# Patient Record
Sex: Female | Born: 2000
Health system: Southern US, Community
[De-identification: ages and names within clinical notes are randomized; demographics above are authoritative.]

## PROBLEM LIST (undated history)

## (undated) DIAGNOSIS — T384X5A Adverse effect of oral contraceptives, initial encounter: Secondary | ICD-10-CM

## (undated) DIAGNOSIS — E669 Obesity, unspecified: Secondary | ICD-10-CM

## (undated) DIAGNOSIS — Z7289 Other problems related to lifestyle: Secondary | ICD-10-CM

## (undated) DIAGNOSIS — F419 Anxiety disorder, unspecified: Secondary | ICD-10-CM

## (undated) HISTORY — DX: Adverse effect of oral contraceptives, initial encounter: T38.4X5A

## (undated) HISTORY — DX: Obesity, unspecified: E66.9

## (undated) HISTORY — DX: Other problems related to lifestyle: Z72.89

## (undated) HISTORY — DX: Anxiety disorder, unspecified: F41.9

## (undated) HISTORY — PX: APPENDECTOMY: SHX54

---
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
## 2022-08-13 ENCOUNTER — Ambulatory Visit (INDEPENDENT_AMBULATORY_CARE_PROVIDER_SITE_OTHER): Payer: 59 | Admitting: Nurse Practitioner

## 2022-08-13 ENCOUNTER — Encounter: Payer: Self-pay | Admitting: Nurse Practitioner

## 2022-08-13 VITALS — BP 118/68 | HR 65 | Ht 67.75 in | Wt 293.2 lb

## 2022-08-13 DIAGNOSIS — R599 Enlarged lymph nodes, unspecified: Secondary | ICD-10-CM | POA: Diagnosis not present

## 2022-08-13 DIAGNOSIS — Z789 Other specified health status: Secondary | ICD-10-CM | POA: Diagnosis not present

## 2022-08-13 DIAGNOSIS — Z6841 Body Mass Index (BMI) 40.0 and over, adult: Secondary | ICD-10-CM | POA: Diagnosis not present

## 2022-08-13 DIAGNOSIS — Z0000 Encounter for general adult medical examination without abnormal findings: Secondary | ICD-10-CM | POA: Insufficient documentation

## 2022-08-13 DIAGNOSIS — Z833 Family history of diabetes mellitus: Secondary | ICD-10-CM | POA: Insufficient documentation

## 2022-08-13 DIAGNOSIS — N341 Nonspecific urethritis: Secondary | ICD-10-CM

## 2022-08-13 DIAGNOSIS — A493 Mycoplasma infection, unspecified site: Secondary | ICD-10-CM

## 2022-08-13 NOTE — Assessment & Plan Note (Signed)
Will monitor labs today.

## 2022-08-13 NOTE — Assessment & Plan Note (Signed)
Patient reports a tender lump at the bottom of the right ear. Examination revealed a swollen lymph node on the right side at post auricular chain, less than 1cm. No other symptoms.  Plan: Patient to monitor the lump for changes in size, tenderness, or other symptoms. If the lump worsens or does not improve, consider an ultrasound for further evaluation. Suspect this is a simple immune response

## 2022-08-13 NOTE — Assessment & Plan Note (Signed)
Patient expresses concerns regarding weight and potential future health risks. She is working on diet and exercise, but this is not making a big difference in her size, which is her concern.  Plan: Encourage continuation of a healthy lifestyle with a balanced diet and regular exercise. Monitor blood work to assess for underlying metabolic issues. Discuss potential medication options for weight loss if indicated after lab results. Detailed dietary information provided on AVS

## 2022-08-13 NOTE — Assessment & Plan Note (Signed)

## 2022-08-13 NOTE — Progress Notes (Signed)
Worthy Keeler, DNP, AGNP-c [place], [place] [address] Main Office [phone]  BP 118/68   Pulse 65   Ht 5' 7.75" (1.721 m)   Wt 293 lb 3.2 oz (133 kg)   SpO2 98%   BMI 44.91 kg/m    Subjective:    Patient ID: Sharon Burns, female    DOB: May 16, 2001, 22 y.o.   MRN: [id number]  HPI: Sharon Burns is a 22 y.o. female presenting on 08/13/2022 for comprehensive medical examination.   The patient, Sharon Burns, presents today for an established care visit and physical. She reports a tender lump on the bottom of her right ear, approximately the size of a quarter. Sharon Burns is unsure if she received the HPV vaccines as a teenager and has not been to a doctor in a long time. She had a flu shot in October and currently has an IUD in place. Her medical history includes anxiety, for which she no longer takes medication. She has a history of vaping for one year but has been clean for 10 months.  Sharon Burns experienced a recent health scare after rock climbing and consuming greasy pizza, which resulted in shaking and feeling unwell, but she recovered the next day. She is in a monogamous relationship and agrees to STD testing. She reports no concerns with hearing, only the lump near her ear. She describes a sensation of phlegm in her throat that won't go down.  The patient is a vegetarian and focuses on nutritional value, fiber, and balanced macronutrients in her diet. She exercises through work, walking at [facility], and rock climbing two to three times a week. Sharon Burns reports occasional constipation, which she attributes to stress from work. She has not had a period in two months due to her IUD. She denies any swelling in her feet or ankles.  Sharon Burns expresses concern about her weight and family history of obesity, type 2 diabetes, and breast cancer. She has made lifestyle changes, including adopting a healthy diet and engaging in regular exercise, but is  worried about potential future health risks related to her weight. She is interested in exploring options for weight loss, including medication, and seeks advice on maintaining a healthy immune system.  IMMUNIZATIONS:   Flu vaccine: Completed in October Prevnar 13: Prevnar 13 N/A for this patient Prevnar 20: Prevnar 64 N/A for this patient Pneumovax 23: Pneumovax 23 N/A for this patient Vac Shingrix: Shingrix N/A for this patient HPV: Unsure Tetanus: Tetanus completed in the last 10 years  HEALTH MAINTENANCE: Pap Smear HM Status: is not applicable for this patient Mammogram HM Status: is not applicable for this patient Colon Cancer Screening HM Status: is not applicable for this patient Bone Density HM Status: is not applicable for this patient STI Testing HM Status: was completed today  She reports regular vision exams q1-5y: No  She reports regular dental exams q 69m  No  She follows a vegetarian diet and ensures that she gets proteins. She avoids pre-packed meals.She cooks most of her own meals. She focuses on nutritional value of foods.  She endorses exercise and/or activity of:  2-3 times a week vigorous rock climbing.  She currently: Marital Status: co-habitating Living situation: with family Sexual: monogamous Occupation: pCharity fundraiserat [employer]  Pertinent items are noted in HPI.  Most Recent Depression Screen:     08/13/2022   10:43 AM 03/19/2021    1:16 PM 02/19/2021    9:43 AM 04/13/2020  4:03 PM 01/16/2015   11:16 AM  Depression screen PHQ 2/9  Decreased Interest 0   0 1  Down, Depressed, Hopeless 0   0 2  PHQ - 2 Score 0   0 3  Altered sleeping 1    0  Tired, decreased energy 2    0  Change in appetite 2    0  Feeling bad or failure about yourself  0    0  Trouble concentrating 0    0  Moving slowly or fidgety/restless 0    0  Suicidal thoughts 0    0  PHQ-9 Score 5    3  Difficult doing work/chores Not difficult at all         Information  is confidential and restricted. Go to Review Flowsheets to unlock data.   Most Recent Anxiety Screen:     03/19/2021    1:18 PM 04/13/2020    4:03 PM  GAD 7 : Generalized Anxiety Score  Nervous, Anxious, on Edge  0  Control/stop worrying  0  Worry too much - different things  0  Trouble relaxing  0  Restless  0  Easily annoyed or irritable  0  Afraid - awful might happen  0  Total GAD 7 Score  0  Anxiety Difficulty  Not difficult at all     Information is confidential and restricted. Go to Review Flowsheets to unlock data.   Most Recent Fall Screen:    08/13/2022   10:43 AM 02/26/2021    3:30 PM 01/16/2015   11:16 AM  Fall Risk   Falls in the past year? 0 0 Yes  Number falls in past yr: 0 0 1  Injury with Fall? 0 0 No  Risk for fall due to : No Fall Risks No Fall Risks   Follow up Falls evaluation completed Falls evaluation completed     Past medical history, surgical history, medications, allergies, family history and social history reviewed with patient today and changes made to appropriate areas of the chart.  Past Medical History:  Past Medical History:  Diagnosis Date   Anxiety    Obesity (BMI 35.0-39.9 without comorbidity)    Oral contraceptive causing adverse effect in therapeutic use    Self-mutilation    Medications:  Current Outpatient Medications on File Prior to Visit  Medication Sig   levonorgestrel (MIRENA) 20 MCG/DAY IUD 1 each by Intrauterine route once.   No current facility-administered medications on file prior to visit.   Surgical History:  Past Surgical History:  Procedure Laterality Date   APPENDECTOMY     LAPAROSCOPIC APPENDECTOMY N/A 09/10/2016   Procedure: APPENDECTOMY LAPAROSCOPIC;  Surgeon: Gerald Stabs, MD;  Location: [facility] OR;  Service: General;  Laterality: N/A;   Allergies:  No Known Allergies Family History:  Family History  Problem Relation Age of Onset   Diabetes Mother    Hypertension Mother    COPD Maternal Aunt         Breast   Cancer Maternal Aunt    Hypertension Maternal Aunt    Alcohol abuse Maternal Grandfather    Hypertension Paternal Grandfather    Diabetes Paternal Grandfather    Bipolar disorder Sister    Anxiety disorder Sister    ADD / ADHD Sister    Alcohol abuse Other    Drug abuse Other        Objective:    BP 118/68   Pulse 65   Ht 5' 7.75" (1.721 m)  Wt 293 lb 3.2 oz (133 kg)   SpO2 98%   BMI 44.91 kg/m   Wt Readings from Last 3 Encounters:  08/13/22 293 lb 3.2 oz (133 kg) (>99 %, Z= 2.80)*  10/04/21 257 lb (116.6 kg) (>99 %, Z= 2.52)*  07/13/21 272 lb (123.4 kg) (>99 %, Z= 2.60)*   * Growth percentiles are based on CDC (Girls, 2-20 Years) data.    Physical Exam Vitals and nursing note reviewed.  Constitutional:      General: She is not in acute distress.    Appearance: Normal appearance.  HENT:     Head: Normocephalic and atraumatic.     Right Ear: Hearing, tympanic membrane, ear canal and external ear normal.     Left Ear: Hearing, tympanic membrane, ear canal and external ear normal.     Nose: Nose normal.     Right Sinus: No maxillary sinus tenderness or frontal sinus tenderness.     Left Sinus: No maxillary sinus tenderness or frontal sinus tenderness.     Mouth/Throat:     Lips: Pink.     Mouth: Mucous membranes are moist.     Pharynx: Oropharynx is clear.  Eyes:     General: Lids are normal. Vision grossly intact.     Extraocular Movements: Extraocular movements intact.     Conjunctiva/sclera: Conjunctivae normal.     Pupils: Pupils are equal, round, and reactive to light.     Funduscopic exam:    Right eye: Red reflex present.        Left eye: Red reflex present.    Visual Fields: Right eye visual fields normal and left eye visual fields normal.  Neck:     Thyroid: No thyromegaly.     Vascular: No carotid bruit.  Cardiovascular:     Rate and Rhythm: Normal rate and regular rhythm.     Chest Wall: PMI is not displaced.     Pulses: Normal pulses.           Dorsalis pedis pulses are 2+ on the right side and 2+ on the left side.       Posterior tibial pulses are 2+ on the right side and 2+ on the left side.     Heart sounds: Normal heart sounds. No murmur heard. Pulmonary:     Effort: Pulmonary effort is normal. No respiratory distress.     Breath sounds: Normal breath sounds.  Abdominal:     General: Abdomen is flat. Bowel sounds are normal. There is no distension.     Palpations: Abdomen is soft. There is no hepatomegaly, splenomegaly or mass.     Tenderness: There is no abdominal tenderness. There is no right CVA tenderness, left CVA tenderness, guarding or rebound.  Musculoskeletal:        General: Normal range of motion.     Cervical back: Full passive range of motion without pain, normal range of motion and neck supple. No tenderness.     Right lower leg: No edema.     Left lower leg: No edema.  Feet:     Left foot:     Toenail Condition: Left toenails are normal.  Lymphadenopathy:     Head:     Right side of head: Posterior auricular adenopathy present.     Left side of head: No posterior auricular adenopathy.     Cervical: No cervical adenopathy.     Upper Body:     Right upper body: No supraclavicular adenopathy.  Left upper body: No supraclavicular adenopathy.     Comments: Appx 1cm firm, round, smooth lymph node palpated on right post auricular chain. No alarm symptoms  Skin:    General: Skin is warm and dry.     Capillary Refill: Capillary refill takes less than 2 seconds.     Nails: There is no clubbing.  Neurological:     General: No focal deficit present.     Mental Status: She is alert and oriented to person, place, and time.     GCS: GCS eye subscore is 4. GCS verbal subscore is 5. GCS motor subscore is 6.     Sensory: Sensation is intact.     Motor: Motor function is intact.     Coordination: Coordination is intact.     Gait: Gait is intact.     Deep Tendon Reflexes: Reflexes are normal and symmetric.   Psychiatric:        Attention and Perception: Attention normal.        Mood and Affect: Mood normal.        Speech: Speech normal.        Behavior: Behavior normal. Behavior is cooperative.        Thought Content: Thought content normal.        Cognition and Memory: Cognition and memory normal.        Judgment: Judgment normal.     Results for orders placed or performed during the hospital encounter of 10/04/21  Novel Coronavirus, NAA ([facility])   Specimen: Nasopharyngeal Swab; Nasopharyngeal(NP) swabs in vial transport medium   Nasopharynge  Release  Result Value Ref Range   SARS-CoV-2, NAA Not Detected Not Detected  Culture, group A strep   Specimen: Throat  Result Value Ref Range   Specimen Description THROAT    Special Requests NONE    Culture      NO GROUP A STREP (S.PYOGENES) ISOLATED Performed at [facility] [address], [place], [place] [address]    Report Status 10/07/2021 FINAL   POCT rapid strep A  Result Value Ref Range   Rapid Strep A Screen Negative Negative         Assessment & Plan:   Problem List Items Addressed This Visit     Annual physical exam - Primary    CPE today with no abnormalities noted on exam.  Labs pending. Will make changes as necessary based on results.  Review of HM activities and recommendations discussed and provided on AVS Anticipatory guidance, diet, and exercise recommendations provided.  Medications, allergies, and hx reviewed and updated as necessary.  Plan to f/u with CPE in 1 year or sooner for acute/chronic health needs as directed.        Relevant Orders   Visual acuity screening   CBC with Differential/Platelet   Comprehensive metabolic panel   123456 and Folate Panel   Iron, TIBC and Ferritin Panel   Hemoglobin A1c   VITAMIN D 25 Hydroxy (Vit-D Deficiency, Fractures)   TSH   Ct, Ng, Mycoplasmas NAA, Urine   HSV(herpes simplex vrs) 1+2 ab-IgG   RPR   HIV Antibody (routine testing w rflx)   Hepatitis C  antibody   Vegetarian diet    Will monitor labs today.       Relevant Orders   CBC with Differential/Platelet   Comprehensive metabolic panel   123456 and Folate Panel   Iron, TIBC and Ferritin Panel   Hemoglobin A1c   VITAMIN D 25 Hydroxy (Vit-D  Deficiency, Fractures)   TSH   Family history of diabetes mellitus (DM)    Will monitor labs today.       Relevant Orders   CBC with Differential/Platelet   Comprehensive metabolic panel   123456 and Folate Panel   Iron, TIBC and Ferritin Panel   Hemoglobin A1c   VITAMIN D 25 Hydroxy (Vit-D Deficiency, Fractures)   TSH   Swollen lymph nodes    Patient reports a tender lump at the bottom of the right ear. Examination revealed a swollen lymph node on the right side at post auricular chain, less than 1cm. No other symptoms.  Plan: Patient to monitor the lump for changes in size, tenderness, or other symptoms. If the lump worsens or does not improve, consider an ultrasound for further evaluation. Suspect this is a simple immune response       BMI 40.0-44.9, adult [facility])    Patient expresses concerns regarding weight and potential future health risks. She is working on diet and exercise, but this is not making a big difference in her size, which is her concern.  Plan: Encourage continuation of a healthy lifestyle with a balanced diet and regular exercise. Monitor blood work to assess for underlying metabolic issues. Discuss potential medication options for weight loss if indicated after lab results. Detailed dietary information provided on AVS      Other Visit Diagnoses     Health care maintenance       Relevant Orders   CBC with Differential/Platelet   Comprehensive metabolic panel   123456 and Folate Panel   Iron, TIBC and Ferritin Panel   Hemoglobin A1c   VITAMIN D 25 Hydroxy (Vit-D Deficiency, Fractures)   TSH   Ct, Ng, Mycoplasmas NAA, Urine   HSV(herpes simplex vrs) 1+2 ab-IgG   RPR   HIV Antibody (routine testing w rflx)    Hepatitis C antibody          Follow up plan: Return in about 1 year (around 08/14/2023) for CPE.  NEXT PREVENTATIVE PHYSICAL DUE IN 1 YEAR.  PATIENT COUNSELING PROVIDED FOR ALL ADULT PATIENTS:  Consume a well balanced diet low in saturated fats, cholesterol, and moderation in carbohydrates.   This can be as simple as monitoring portion sizes and cutting back on sugary beverages such as soda and juice to start with.    Daily water consumption of at least 64 ounces.  Physical activity at least 180 minutes per week, if just starting out.   This can be as simple as taking the stairs instead of the elevator and walking 2-3 laps around the office  purposefully every day.   STD protection, partner selection, and regular testing if high risk.  Limited consumption of alcoholic beverages if alcohol is consumed.  For women, I recommend no more than 7 alcoholic beverages per week, spread out throughout the week.  Avoid "binge" drinking or consuming large quantities of alcohol in one setting.   Please let me know if you feel you may need help with reduction or quitting alcohol consumption.   Avoidance of nicotine, if used.  Please let me know if you feel you may need help with reduction or quitting nicotine use.   Daily mental health attention.  This can be in the form of 5 minute daily meditation, prayer, journaling, yoga, reflection, etc.   Purposeful attention to your emotions and mental state can significantly improve your overall wellbeing and Health.  Please know that I am here to help you with all  of your health care goals and am happy to work with you to find a solution that works best for you.  The greatest advice I have received with any changes in life are to take it one step at a time, that even means if all you can focus on is the next 60 seconds, then do that and celebrate your victories.  With any changes in life, you will have set backs, and that is OK. The important thing to  remember is, if you have a set back, it is not a failure, it is an opportunity to try again!  Health Maintenance Recommendations Screening Testing Mammogram Every 1 -2 years based on history and risk factors Starting at age 28 Pap Smear Ages 21-39 every 3 years Ages 52-65 every 5 years with HPV testing More frequent testing may be required based on results and history Colon Cancer Screening Every 1-10 years based on test performed, risk factors, and history Starting at age 65 Bone Density Screening Every 2-10 years based on history Starting at age 47 for women Recommendations for men differ based on medication usage, history, and risk factors AAA Screening One time ultrasound Men 1-63 years old who have every smoked Lung Cancer Screening Low Dose Lung CT every 12 months Age 60-80 years with a 30 pack-year smoking history who still smoke or who have quit within the last 15 years  Screening Labs Routine  Labs: Complete Blood Count (CBC), Complete Metabolic Panel (CMP), Cholesterol (Lipid Panel) Every 6-12 months based on history and medications May be recommended more frequently based on current conditions or previous results Hemoglobin A1c Lab Every 3-12 months based on history and previous results Starting at age 1 or earlier with diagnosis of diabetes, high cholesterol, BMI >26, and/or risk factors Frequent monitoring for patients with diabetes to ensure blood sugar control Thyroid Panel (TSH w/ T3 & T4) Every 6 months based on history, symptoms, and risk factors May be repeated more often if on medication HIV One time testing for all patients 59 and older May be repeated more frequently for patients with increased risk factors or exposure Hepatitis C One time testing for all patients 3 and older May be repeated more frequently for patients with increased risk factors or exposure Gonorrhea, Chlamydia Every 12 months for all sexually active persons 13-24  years Additional monitoring may be recommended for those who are considered high risk or who have symptoms PSA Men 50-64 years old with risk factors Additional screening may be recommended from age 33-69 based on risk factors, symptoms, and history  Vaccine Recommendations Tetanus Booster All adults every 10 years Flu Vaccine All patients 6 months and older every year COVID Vaccine All patients 12 years and older Initial dosing with booster May recommend additional booster based on age and health history HPV Vaccine 2 doses all patients age 28-26 Dosing may be considered for patients over 26 Shingles Vaccine (Shingrix) 2 doses all adults 27 years and older Pneumonia (Pneumovax 23) All adults 78 years and older May recommend earlier dosing based on health history Pneumonia (Prevnar 53) All adults 45 years and older Dosed 1 year after Pneumovax 23  Additional Screening, Testing, and Vaccinations may be recommended on an individualized basis based on family history, health history, risk factors, and/or exposure.

## 2022-08-13 NOTE — Patient Instructions (Addendum)
Dear Sharon Burns,  Thank you for visiting our clinic on Tue Aug 13, 2022. Your proactive approach to your health is commendable, and it was a pleasure to discuss your health concerns and lifestyle choices. Below, please find a summary of the key instructions and recommendations from your visit:  1. Monitor the lump on the bottom of your right ear for any changes in size or tenderness, and report any increases in size or changes.  2. Maintain your healthy vegetarian diet, ensuring it is rich in nutritional value, fiber, and a balanced intake of carbohydrates and fats.  3. Continue with your exercise routine, which includes rock climbing and walking at [facility]  4. For constipation, consider using a stool softener such as docusate sodium (Colace) or Miralax, starting with a single dose once daily.  5. Keep up with regular eye and dental exams to maintain overall health.  6. Supplement your diet with vitamin C, vitamin D, and possibly zinc and B12, given your vegetarian diet. We will check your vitamin D levels today.  7. If you are planning for pregnancy in the future, start a folic acid supplement or prenatal vitamin in advance.  8. Include antioxidants in your diet by consuming fruits like berries for their health benefits.  9. Choose between gummy and pill multivitamins based on your preference, ensuring gummies are chewed thoroughly.  10. We will check your B12 and vitamin D levels due to your dietary choices.  11. Continue to focus on how you feel rather than the number on the scale, and maintain your healthy eating and exercise habits.  12. Aim for 150 minutes of moderate activity per week, keeping your heart rate at a conversational level.  13. Increase your water intake to at least 64 ounces per day and aim for 30 grams of fiber to aid in digestion and satiety.  14. Consider using smaller plates for portion control to support weight management.  15. We will review your  lab results to assess for any underlying issues that may affect weight management and discuss medication options if appropriate.  16. Avoid rapid weight loss and instead focus on gradual, sustainable lifestyle changes for long-term health.  Please monitor your symptoms and contact us if you have any concerns or if there are changes with your lymph node. We are here to support you in your journey to better health.  Best regards,  SaraBeth Ruqayya Ventress, DNP, AGNP-c    WEIGHT LOSS PLANNING     08/13/2022   10:42 AM 10/04/2021    1:39 PM 10/04/2021    1:37 PM  Vitals with BMI  Height 5' 7.75"  5' 7"$   Weight 293 lbs 3 oz  257 lbs  BMI [id number]  [id number]  Systolic [id number]   Diastolic 68 82   Pulse 65 104     For best management of weight, it is vital to balance intake versus output. This means the number of calories burned per day must be less than the calories you take in with food and drink.   I recommend trying to follow a diet with the following: Calories: 1200-1500 calories per day Carbohydrates: 150-180 grams of carbohydrates per day  Why: Gives your body enough "quick fuel" for cells to maintain normal function without sending them into starvation mode.  Protein: At least 45-55 grams of protein per day Why: Protein takes longer and uses more energy than carbohydrates to break down for fuel. The carbohydrates in your meals serves as quick energy  sources and proteins help use some of that extra quick energy to break down to produce long term energy. This helps you not feel hungry as quickly and protein breakdown burns calories.  Water: Drink AT LEAST 64 ounces of water per day  Why: Water is essential to healthy metabolism. Water helps to fill the stomach and keep you fuller longer. Water is required for healthy digestion and filtering of waste in the body.  Fat: Limit fats in your diet- when choosing fats, choose foods with lower fats content such as lean meats (chicken, fish, [place]).  Why:  Increased fat intake leads to storage "for later". Once you burn your carbohydrate energy, your body goes into fat and protein breakdown mode to help you loose weight.  Cholesterol: Fats and oils that are LIQUID at room temperature are best. Choose vegetable oils (olive oil, avocado oil, nuts). Avoid fats that are SOLID at room temperature (animal fats, processed meats). Healthy fats are often found in whole grains, beans, nuts, seeds, and berries.  Why: Elevated cholesterol levels lead to build up of cholesterol on the inside of your blood vessels. This will eventually cause the blood vessels to become hard and can lead to high blood pressure and damage to your organs. When the blood flow is reduced, but the pressure is high from cholesterol buildup, parts of the cholesterol can break off and form clots that can go to the brain or heart leading to a stroke or heart attack.  Fiber: Increase amount of SOLUBLE the fiber in your diet. This helps to fill you up, lowers cholesterol, and helps with digestion. Some foods high in soluble fiber are oats, peas, beans, apples, carrots, barley, and citrus fruits.   Why: Fiber fills you up, helps remove excess cholesterol, and aids in healthy digestion which are all very important in weight management.   I recommend the following as a minimum activity routine: Purposeful walk or other physical activity at least 20 minutes every single day. This means purposefully taking a walk, jog, bike, swim, treadmill, elliptical, dance, etc.  This activity should be ABOVE your normal daily activities, such as walking at work. Goal exercise should be at least 150 minutes a week- work your way up to this.   Heart Rate: Your maximum exercise heart rate should be 220 - Your Age in Years. When exercising, get your heart rate up, but avoid going over the maximum targeted heart rate.  60-70% of your maximum heart rate is where you tend to burn the most fat. To find this number:  220  - Age In Years= Max HR  Max HR x 0.6 (or 0.7) = Fat Burning HR The Fat Burning HR is your goal heart rate while working out to burn the most fat.  NEVER exercise to the point your feel lightheaded, weak, nauseated, dizzy. If you experience ANY of these symptoms- STOP exercise! Allow yourself to cool down and your heart rate to come down. Then restart slower next time.  If at [place] you feel chest pain or chest pressure during exercise, STOP IMMEDIATELY and seek medical attention.

## 2022-08-14 LAB — CBC WITH DIFFERENTIAL/PLATELET
Basophils Absolute: 0 10*3/uL (ref 0.0–0.2)
Basos: 0 %
EOS (ABSOLUTE): 0.1 10*3/uL (ref 0.0–0.4)
Eos: 1 %
Hematocrit: 41.5 % (ref 34.0–46.6)
Hemoglobin: 13.9 g/dL (ref 11.1–15.9)
Immature Grans (Abs): 0 10*3/uL (ref 0.0–0.1)
Immature Granulocytes: 0 %
Lymphocytes Absolute: 1.8 10*3/uL (ref 0.7–3.1)
Lymphs: 19 %
MCH: 26.4 pg — ABNORMAL LOW (ref 26.6–33.0)
MCHC: 33.5 g/dL (ref 31.5–35.7)
MCV: 79 fL (ref 79–97)
Monocytes Absolute: 0.8 10*3/uL (ref 0.1–0.9)
Monocytes: 9 %
Neutrophils Absolute: 6.6 10*3/uL (ref 1.4–7.0)
Neutrophils: 71 %
Platelets: 341 10*3/uL (ref 150–450)
RBC: 5.27 x10E6/uL (ref 3.77–5.28)
RDW: 13.4 % (ref 11.7–15.4)
WBC: 9.3 10*3/uL (ref 3.4–10.8)

## 2022-08-14 LAB — IRON,TIBC AND FERRITIN PANEL
Ferritin: 13 ng/mL — ABNORMAL LOW (ref 15–77)
Iron Saturation: 17 % (ref 15–55)
Iron: 59 ug/dL (ref 27–159)
Total Iron Binding Capacity: 340 ug/dL (ref 250–450)
UIBC: 281 ug/dL (ref 131–425)

## 2022-08-14 LAB — COMPREHENSIVE METABOLIC PANEL
ALT: 16 IU/L (ref 0–32)
AST: 17 IU/L (ref 0–40)
Albumin/Globulin Ratio: 1.6 (ref 1.2–2.2)
Albumin: 4.1 g/dL (ref 4.0–5.0)
Alkaline Phosphatase: 76 IU/L (ref 42–106)
BUN/Creatinine Ratio: 19 (ref 9–23)
BUN: 14 mg/dL (ref 6–20)
Bilirubin Total: 0.3 mg/dL (ref 0.0–1.2)
CO2: 18 mmol/L — ABNORMAL LOW (ref 20–29)
Calcium: 9.6 mg/dL (ref 8.7–10.2)
Chloride: 106 mmol/L (ref 96–106)
Creatinine, Ser: 0.73 mg/dL (ref 0.57–1.00)
Globulin, Total: 2.6 g/dL (ref 1.5–4.5)
Glucose: 81 mg/dL (ref 70–99)
Potassium: 4.6 mmol/L (ref 3.5–5.2)
Sodium: 137 mmol/L (ref 134–144)
Total Protein: 6.7 g/dL (ref 6.0–8.5)
eGFR: 121 mL/min/{1.73_m2} (ref >59)

## 2022-08-14 LAB — HEMOGLOBIN A1C
Est. average glucose Bld gHb Est-mCnc: 97 mg/dL
Hgb A1c MFr Bld: 5 % (ref 4.8–5.6)

## 2022-08-14 LAB — TSH: TSH: 1.2 u[IU]/mL (ref 0.450–4.500)

## 2022-08-14 LAB — B12 AND FOLATE PANEL
Folate: 10 ng/mL (ref >3.0)
Vitamin B-12: 417 pg/mL (ref 232–1245)

## 2022-08-14 LAB — VITAMIN D 25 HYDROXY (VIT D DEFICIENCY, FRACTURES): Vit D, 25-Hydroxy: 29.4 ng/mL — ABNORMAL LOW (ref 30.0–100.0)

## 2022-08-15 LAB — CT, NG, MYCOPLASMAS NAA, URINE
Chlamydia trachomatis, NAA: NEGATIVE
Mycoplasma genitalium NAA: NEGATIVE
Mycoplasma hominis NAA: NEGATIVE
Neisseria gonorrhoeae, NAA: NEGATIVE
Ureaplasma spp NAA: POSITIVE — AB

## 2022-08-20 ENCOUNTER — Encounter: Payer: Self-pay | Admitting: Nurse Practitioner

## 2022-08-20 ENCOUNTER — Other Ambulatory Visit (HOSPITAL_COMMUNITY): Payer: Self-pay

## 2022-08-20 ENCOUNTER — Ambulatory Visit (INDEPENDENT_AMBULATORY_CARE_PROVIDER_SITE_OTHER): Payer: 59 | Admitting: Nurse Practitioner

## 2022-08-20 VITALS — BP 124/82 | HR 89 | Wt 291.8 lb

## 2022-08-20 DIAGNOSIS — Z6841 Body Mass Index (BMI) 40.0 and over, adult: Secondary | ICD-10-CM

## 2022-08-20 DIAGNOSIS — F5081 Binge eating disorder: Secondary | ICD-10-CM | POA: Diagnosis not present

## 2022-08-20 DIAGNOSIS — B379 Candidiasis, unspecified: Secondary | ICD-10-CM

## 2022-08-20 MED ORDER — FLUCONAZOLE 150 MG PO TABS
ORAL_TABLET | ORAL | 2 refills | Status: DC
  Filled 2022-08-20: qty 2, 3d supply, fill #0

## 2022-08-20 MED ORDER — ONDANSETRON HCL 4 MG PO TABS
4.0000 mg | ORAL_TABLET | Freq: Three times a day (TID) | ORAL | 0 refills | Status: DC | PRN
  Filled 2022-08-20: qty 20, 7d supply, fill #0

## 2022-08-20 MED ORDER — ZEPBOUND 2.5 MG/0.5ML ~~LOC~~ SOAJ
2.5000 mg | SUBCUTANEOUS | 0 refills | Status: DC
  Filled 2022-08-20: qty 0.5, 7d supply, fill #0
  Filled 2022-08-24: qty 2, 28d supply, fill #0

## 2022-08-20 MED ORDER — AZITHROMYCIN 500 MG PO TABS
1000.0000 mg | ORAL_TABLET | Freq: Once | ORAL | 0 refills | Status: AC
  Filled 2022-08-20: qty 2, 1d supply, fill #0

## 2022-08-20 NOTE — Addendum Note (Signed)
Addended by: Neasia Fleeman, Clarise Cruz E on: 08/20/2022 07:46 AM   Modules accepted: Orders

## 2022-08-20 NOTE — Assessment & Plan Note (Signed)
Elevated BMI with concerns of lack of weight loss with diet and exercise management. Lengthy discussion on medications available, mechanism of action, expectations, and lifestyle changes to ensure weight loss is maintained.  Plan: - Discuss options for weight loss medication, including Wegovy and Zepbound. - Prescription sent, will need to obtain prior authorization for the selected injectable weight loss medication (Zepbound). - Educated the patient on healthy habits, portion control, and regular exercise. - Monitor weight loss progress and adjust medication dosage accordingly.

## 2022-08-20 NOTE — Progress Notes (Signed)
  Orma Render, DNP, AGNP-c [place], [place] [address] [phone]  Subjective:   Sharon Burns is a 22 y.o. female presents to day for evaluation of: Weight Management Lelan Pons would like to discuss options for weight loss medications. She expresses a keen interest in injectable weight loss medications after discussion of options. She seeks information regarding the potential side effects, the expected duration of use, and the success rates associated with these medications.  Additionally, she addresses her dietary habits, emphasizing the significance of portion control, especially when considering the use of weight loss medications.  PMH, Medications, and Allergies reviewed and updated in chart as appropriate.   ROS negative except for what is listed in HPI. Objective:  BP 124/82   Pulse 89   Wt 291 lb 12.8 oz (132.4 kg)   BMI 44.70 kg/m  Physical Exam Vitals and nursing note reviewed.  Constitutional:      Appearance: Normal appearance. She is obese.  HENT:     Head: Normocephalic.  Eyes:     Pupils: Pupils are equal, round, and reactive to light.  Cardiovascular:     Rate and Rhythm: Normal rate and regular rhythm.     Pulses: Normal pulses.     Heart sounds: Normal heart sounds.  Pulmonary:     Effort: Pulmonary effort is normal.     Breath sounds: Normal breath sounds.  Musculoskeletal:     Cervical back: Normal range of motion.  Skin:    General: Skin is warm and dry.     Capillary Refill: Capillary refill takes less than 2 seconds.  Neurological:     General: No focal deficit present.     Mental Status: She is alert.  Psychiatric:        Mood and Affect: Mood normal.           Assessment & Plan:   Problem List Items Addressed This Visit     BMI 40.0-44.9, adult ([place])    Elevated BMI with concerns of lack of weight loss with diet and exercise management. Lengthy discussion on medications available, mechanism  of action, expectations, and lifestyle changes to ensure weight loss is maintained.  Plan: - Discuss options for weight loss medication, including Wegovy and Zepbound. - Prescription sent, will need to obtain prior authorization for the selected injectable weight loss medication (Zepbound). - Educated the patient on healthy habits, portion control, and regular exercise. - Monitor weight loss progress and adjust medication dosage accordingly.       Relevant Medications   tirzepatide (ZEPBOUND) 2.5 MG/0.5ML Pen   ondansetron (ZOFRAN) 4 MG tablet   Binge eating disorder   Relevant Medications   tirzepatide (ZEPBOUND) 2.5 MG/0.5ML Pen   ondansetron (ZOFRAN) 4 MG tablet   Other Visit Diagnoses     Candida infection    -  Primary   Relevant Medications   fluconazole (DIFLUCAN) 150 MG tablet       Time: 50 minutes, >50% spent counseling, care coordination, chart review, and documentation.    Orma Render, DNP, AGNP-c 08/20/2022  6:17 PM    History, Medications, Surgery, SDOH, and Family History reviewed and updated as appropriate.

## 2022-08-20 NOTE — Patient Instructions (Signed)
Look at [email or web address] and see if they have a coupon for the medication that you can use. It is usually about $25 for the medication. The Prior Authorization will take a little while, sometimes 2 weeks.

## 2022-08-22 ENCOUNTER — Other Ambulatory Visit (HOSPITAL_COMMUNITY): Payer: Self-pay

## 2022-08-22 DIAGNOSIS — Z01419 Encounter for gynecological examination (general) (routine) without abnormal findings: Secondary | ICD-10-CM | POA: Diagnosis not present

## 2022-08-26 ENCOUNTER — Other Ambulatory Visit (HOSPITAL_COMMUNITY): Payer: Self-pay

## 2022-08-26 ENCOUNTER — Other Ambulatory Visit: Payer: Self-pay

## 2022-08-27 ENCOUNTER — Encounter: Payer: Self-pay | Admitting: Nurse Practitioner

## 2022-09-05 ENCOUNTER — Telehealth: Payer: Self-pay | Admitting: Family Medicine

## 2022-09-05 NOTE — Telephone Encounter (Signed)
[place] ZEPBOUND denied.  No weight loss meds covered by ins.

## 2022-09-09 ENCOUNTER — Encounter: Payer: Self-pay | Admitting: Nurse Practitioner

## 2022-09-19 ENCOUNTER — Other Ambulatory Visit: Payer: Self-pay

## 2022-09-20 ENCOUNTER — Other Ambulatory Visit (HOSPITAL_COMMUNITY): Payer: Self-pay

## 2022-09-27 ENCOUNTER — Other Ambulatory Visit (HOSPITAL_COMMUNITY): Payer: Self-pay

## 2022-10-01 ENCOUNTER — Other Ambulatory Visit (HOSPITAL_COMMUNITY): Payer: Self-pay

## 2022-10-30 ENCOUNTER — Ambulatory Visit
Admission: EM | Admit: 2022-10-30 | Discharge: 2022-10-30 | Disposition: A | Discharge status: Home / Self Care | Payer: 59 | Attending: Emergency Medicine | Admitting: Emergency Medicine

## 2022-10-30 ENCOUNTER — Encounter: Payer: Self-pay | Admitting: Nurse Practitioner

## 2022-10-30 ENCOUNTER — Other Ambulatory Visit (HOSPITAL_COMMUNITY): Payer: Self-pay

## 2022-10-30 DIAGNOSIS — J020 Streptococcal pharyngitis: Secondary | ICD-10-CM | POA: Diagnosis not present

## 2022-10-30 LAB — POCT RAPID STREP A (OFFICE): Rapid Strep A Screen: POSITIVE — AB

## 2022-10-30 MED ORDER — AMOXICILLIN 500 MG PO CAPS
500.0000 mg | ORAL_CAPSULE | Freq: Two times a day (BID) | ORAL | 0 refills | Status: AC
  Filled 2022-10-30: qty 20, 10d supply, fill #0

## 2022-10-30 NOTE — ED Triage Notes (Signed)
Pt reports she has a very sore throat, hurts to swallow, when she sneezes her throat would bleed when she spits x 1 day  Took throat lozenges and cold and flu meds but only slight relief.

## 2022-10-30 NOTE — Discharge Instructions (Signed)
Your rapid strep test today was positive  Take amoxicillin twice a day for the next 10 days, daily will see improvement in about 48 hours and steady progression from there  To be use of salt gargles throat lozenges, warm liquids, teaspoons of honey and over-the-counter clippers septic spray for comfort  May give Tylenol or Motrin every 6 hours as needed for additional comfort  You may follow-up at urgent care as needed    

## 2022-10-30 NOTE — ED Provider Notes (Signed)
[facility]    CSN: [id number] Arrival date & time: 10/30/22  1641      History   Chief Complaint No chief complaint on file.   HPI Sharon Burns is a 22 y.o. female.   Evaluation of sore throat and pain with swallowing beginning 1 day ago.  Symptoms worsened by sneezing and coughing, some episodes of sneezing and coughing has caused blood in the sputum.  Decreased appetite, difficulty tolerating food, tolerating fluids.  Has attempted use of severe cold and flu medication and throat lozenges which have been minimally helpful.  Denies fevers.  No known sick contacts.    Past Medical History:  Diagnosis Date   Anxiety    Obesity (BMI 35.0-39.9 without comorbidity)    Oral contraceptive causing adverse effect in therapeutic use    Self-mutilation     Patient Active Problem List   Diagnosis Date Noted   Annual physical exam 08/13/2022   Vegetarian diet 08/13/2022   Family history of diabetes mellitus (DM) 08/13/2022   Swollen lymph nodes 08/13/2022   BMI 40.0-44.9, adult ([facility]) 08/13/2022   Generalized anxiety disorder 04/26/2019   Binge eating disorder 04/26/2019    Past Surgical History:  Procedure Laterality Date   APPENDECTOMY     LAPAROSCOPIC APPENDECTOMY N/A 09/10/2016   Procedure: APPENDECTOMY LAPAROSCOPIC;  Surgeon: Leonia Corona, MD;  Location: [facility] OR;  Service: General;  Laterality: N/A;    OB History   No obstetric history on file.      Home Medications    Prior to Admission medications   Medication Sig Start Date End Date Taking? Authorizing Provider  fluconazole (DIFLUCAN) 150 MG tablet Take one tablet by mouth at the first sign of symptoms of yeast. If no resolution, repeat dose in 72 hours. 08/20/22   Tollie Eth, NP  levonorgestrel (MIRENA) 20 MCG/DAY IUD 1 each by Intrauterine route once.    [provider]  ondansetron (ZOFRAN) 4 MG tablet Take 1 tablet (4 mg total) by mouth every 8 (eight) hours as needed for nausea or  vomiting. 08/20/22   Early, Sung Amabile, NP  tirzepatide (ZEPBOUND) 2.5 MG/0.5ML Pen Inject 2.5 mg into the skin once a week. 08/20/22   Tollie Eth, NP    Family History Family History  Problem Relation Age of Onset   Diabetes Mother    Hypertension Mother    COPD Maternal Aunt        Breast   Cancer Maternal Aunt    Hypertension Maternal Aunt    Alcohol abuse Maternal Grandfather    Hypertension Paternal Grandfather    Diabetes Paternal Grandfather    Bipolar disorder Sister    Anxiety disorder Sister    ADD / ADHD Sister    Alcohol abuse Other    Drug abuse Other     Social History Social History   Tobacco Use   Smoking status: Never   Smokeless tobacco: Never   Tobacco comments:    Vaped for 1 year, has not for 10 months  Vaping Use   Vaping Use: Never used  Substance Use Topics   Drug use: Never     Allergies   Patient has no known allergies.   Review of Systems Review of Systems  Constitutional: Negative.   HENT:  Positive for congestion and sore throat. Negative for dental problem, drooling, ear discharge, ear pain, facial swelling, hearing loss, mouth sores, nosebleeds, postnasal drip, rhinorrhea, sinus pressure, sinus pain, sneezing, tinnitus, trouble swallowing and voice  change.   Respiratory:  Positive for cough. Negative for apnea, choking, chest tightness, shortness of breath, wheezing and stridor.   Cardiovascular: Negative.   Gastrointestinal: Negative.   Skin: Negative.   Neurological: Negative.      Physical Exam Triage Vital Signs ED Triage Vitals [10/30/22 1654]  Enc Vitals Group     BP (!) 126/95     Pulse Rate (!) 109     Resp 18     Temp 98.8 F (37.1 C)     Temp Source Oral     SpO2 98 %     Weight      Height      Head Circumference      Peak Flow      Pain Score 8     Pain Loc      Pain Edu?      Excl. in GC?    No data found.  Updated Vital Signs BP (!) 126/95 (BP Location: Right Arm)   Pulse (!) 109   Temp 98.8 F  (37.1 C) (Oral)   Resp 18   LMP 10/15/2022 (Approximate)   SpO2 98%   Visual Acuity Right Eye Distance:   Left Eye Distance:   Bilateral Distance:    Right Eye Near:   Left Eye Near:    Bilateral Near:     Physical Exam Constitutional:      Appearance: Normal appearance.  HENT:     Right Ear: Tympanic membrane, ear canal and external ear normal.     Left Ear: Tympanic membrane, ear canal and external ear normal.     Nose: Congestion present.     Mouth/Throat:     Pharynx: Posterior oropharyngeal erythema present.     Tonsils: No tonsillar exudate. 2+ on the right. 2+ on the left.  Eyes:     Extraocular Movements: Extraocular movements intact.  Cardiovascular:     Rate and Rhythm: Normal rate and regular rhythm.     Pulses: Normal pulses.     Heart sounds: Normal heart sounds.  Pulmonary:     Effort: Pulmonary effort is normal.     Breath sounds: Normal breath sounds.  Musculoskeletal:     Cervical back: Normal range of motion.  Lymphadenopathy:     Cervical: Cervical adenopathy present.  Skin:    General: Skin is warm and dry.  Neurological:     Mental Status: She is alert and oriented to person, place, and time. Mental status is at baseline.      UC Treatments / Results  Labs (all labs ordered are listed, but only abnormal results are displayed) Labs Reviewed  POCT RAPID STREP A (OFFICE)    EKG   Radiology No results found.  Procedures Procedures (including critical care time)  Medications Ordered in UC Medications - No data to display  Initial Impression / Assessment and Plan / UC Course  I have reviewed the triage vital signs and the nursing notes.  Pertinent labs & imaging results that were available during my care of the patient were reviewed by me and considered in my medical decision making (see chart for details).  Strep pharyngitis  Strep pharyngitis  Rapid testing positive, discussed with patient, amoxicillin 10-day course  prescribed, may attempt salt water gargles, throat lozenges, warm to cool liquids per preference, over-the-counter Chloraseptic spray and over-the-counter analgesics for management of discomfort, may follow-up with urgent care as needed if symptoms persist or worsen, note given  Final Clinical Impressions(s) / UC Diagnoses  Final diagnoses:  None   Discharge Instructions   None    ED Prescriptions   None    PDMP not reviewed this encounter.   Valinda Hoar, NP 10/30/22 1731

## 2022-11-13 ENCOUNTER — Ambulatory Visit
Admission: RE | Admit: 2022-11-13 | Discharge: 2022-11-13 | Disposition: A | Discharge status: Home / Self Care | Payer: 59 | Source: Ambulatory Visit | Attending: Family Medicine | Admitting: Family Medicine

## 2022-11-13 VITALS — BP 138/84 | HR 108 | Temp 99.2°F | Resp 18

## 2022-11-13 DIAGNOSIS — J029 Acute pharyngitis, unspecified: Secondary | ICD-10-CM | POA: Insufficient documentation

## 2022-11-13 DIAGNOSIS — J069 Acute upper respiratory infection, unspecified: Secondary | ICD-10-CM | POA: Diagnosis not present

## 2022-11-13 LAB — POCT RAPID STREP A (OFFICE): Rapid Strep A Screen: NEGATIVE

## 2022-11-13 NOTE — ED Provider Notes (Signed)
[facility]   [id number] 11/13/22 Arrival Time: 1635  ASSESSMENT & PLAN:  1. Sore throat   2. Viral URI    Discussed typical duration of likely viral illness. Rapid strep negative. Throat culture sent.    Discharge Instructions      You may use over the counter ibuprofen or acetaminophen as needed.  For a sore throat, over the counter products such as [employer] Peroxyl Mouth Sore Rinse or Chloraseptic Sore Throat Spray may provide some temporary relief. Your rapid strep test was negative today. We have sent your throat swab for culture and will let you know of any positive results.       Follow-up Information     Early, Sung Amabile, NP.   Specialty: Nurse Practitioner Why: If worsening or failing to improve as anticipated. Contact information: [address] [place] [address] [phone]                 Reviewed expectations re: course of current medical issues. Questions answered. Outlined signs and symptoms indicating need for more acute intervention. Understanding verbalized. After Visit Summary given.   SUBJECTIVE: History from: Patient. Sharon Burns is a 22 y.o. female. Pt reports fatigue and low grade temp. She is taking Tylenol. Pt reports fatigue and sore throat.  Recently finished amox for strep; few days ago; completed 10 days. Was feeling better before current symptoms. Denies: cough and difficulty breathing. Tolerating PO intake without n/v/d.  OBJECTIVE:  Vitals:   11/13/22 1721  BP: 138/84  Pulse: (!) 108  Resp: 18  Temp: 99.2 F (37.3 C)  TempSrc: Oral  SpO2: 98%    General appearance: alert; no distress Eyes: PERRLA; EOMI; conjunctiva normal HENT: [place]; AT; with nasal congestion; throat with erythema/cobblestoning Neck: supple  Lungs: speaks full sentences without difficulty; unlabored; CTAB CV: reg; slight tachycardia Extremities: no edema Skin: warm and dry Neurologic: normal gait Psychological: alert and  cooperative; normal mood and affect  Labs: Results for orders placed or performed during the hospital encounter of 11/13/22  POCT rapid strep A  Result Value Ref Range   Rapid Strep A Screen Negative Negative   Labs Reviewed  CULTURE, GROUP A STREP [facility])  POCT RAPID STREP A (OFFICE)    Imaging: No results found.  No Known Allergies  Past Medical History:  Diagnosis Date   Anxiety    Obesity (BMI 35.0-39.9 without comorbidity)    Oral contraceptive causing adverse effect in therapeutic use    Self-mutilation    Social History   Socioeconomic History   Marital status: Significant Other    Spouse name: Not on file   Number of children: Not on file   Years of education: 12   Highest education level: High school graduate  Occupational History   Not on file  Tobacco Use   Smoking status: Never   Smokeless tobacco: Never   Tobacco comments:    Vaped for 1 year, has not for 10 months  Vaping Use   Vaping Use: Never used  Substance and Sexual Activity   Alcohol use: Not on file   Drug use: Never   Sexual activity: Yes    Birth control/protection: I.U.D.  Other Topics Concern   Not on file  Social History Narrative   Lives at home in [place] with mom and dad. New job working at [employer]. Has cats at home. She is also current Consulting civil engineer at [employer].   Social Determinants of Corporate investment banker  Strain: Low Risk  (04/26/2019)   Overall Financial Resource Strain (CARDIA)    Difficulty of Paying Living Expenses: Not hard at all  Food Insecurity: No Food Insecurity (04/26/2019)   Hunger Vital Sign    Worried About Running Out of Food in the Last Year: Never true    Ran Out of Food in the Last Year: Never true  Transportation Needs: No Transportation Needs (04/26/2019)   PRAPARE - Administrator, Civil Service (Medical): No    Lack of Transportation (Non-Medical): No  Physical Activity: Not on file  Stress: Stress Concern Present  (04/26/2019)   [employer] of Occupational Health - Occupational Stress Questionnaire    Feeling of Stress : Very much  Social Connections: Not on file  Intimate Partner Violence: Not on file   Family History  Problem Relation Age of Onset   Diabetes Mother    Hypertension Mother    COPD Maternal Aunt        Breast   Cancer Maternal Aunt    Hypertension Maternal Aunt    Alcohol abuse Maternal Grandfather    Hypertension Paternal Grandfather    Diabetes Paternal Grandfather    Bipolar disorder Sister    Anxiety disorder Sister    ADD / ADHD Sister    Alcohol abuse Other    Drug abuse Other    Past Surgical History:  Procedure Laterality Date   APPENDECTOMY     LAPAROSCOPIC APPENDECTOMY N/A 09/10/2016   Procedure: APPENDECTOMY LAPAROSCOPIC;  Surgeon: Leonia Corona, MD;  Location: [facility] OR;  Service: General;  Laterality: Vertis Kelch, MD 11/13/22 1831

## 2022-11-13 NOTE — Discharge Instructions (Addendum)
You may use over the counter ibuprofen or acetaminophen as needed.  For a sore throat, over the counter products such as [employer]gate Peroxyl Mouth Sore Rinse or Chloraseptic Sore Throat Spray may provide some temporary relief. Your rapid strep test was negative today. We have sent your throat swab for culture and will let you know of any positive results. 

## 2022-11-13 NOTE — ED Triage Notes (Signed)
Pt reports fatigue and low grade temp. She is taking Tylenol. Pt reports fatigue and sore throat.

## 2022-11-15 LAB — CULTURE, GROUP A STREP (THRC)

## 2022-11-16 LAB — CULTURE, GROUP A STREP (THRC)

## 2022-12-26 DIAGNOSIS — M9901 Segmental and somatic dysfunction of cervical region: Secondary | ICD-10-CM | POA: Diagnosis not present

## 2022-12-26 DIAGNOSIS — M9904 Segmental and somatic dysfunction of sacral region: Secondary | ICD-10-CM | POA: Diagnosis not present

## 2022-12-26 DIAGNOSIS — M25552 Pain in left hip: Secondary | ICD-10-CM | POA: Diagnosis not present

## 2022-12-26 DIAGNOSIS — M546 Pain in thoracic spine: Secondary | ICD-10-CM | POA: Diagnosis not present

## 2022-12-26 DIAGNOSIS — M9907 Segmental and somatic dysfunction of upper extremity: Secondary | ICD-10-CM | POA: Diagnosis not present

## 2022-12-26 DIAGNOSIS — M9905 Segmental and somatic dysfunction of pelvic region: Secondary | ICD-10-CM | POA: Diagnosis not present

## 2022-12-26 DIAGNOSIS — M9906 Segmental and somatic dysfunction of lower extremity: Secondary | ICD-10-CM | POA: Diagnosis not present

## 2022-12-26 DIAGNOSIS — M25511 Pain in right shoulder: Secondary | ICD-10-CM | POA: Diagnosis not present

## 2022-12-28 ENCOUNTER — Emergency Department (HOSPITAL_BASED_OUTPATIENT_CLINIC_OR_DEPARTMENT_OTHER)
Admission: EM | Admit: 2022-12-28 | Discharge: 2022-12-28 | Disposition: A | Discharge status: Home / Self Care | Payer: 59 | Attending: Emergency Medicine | Admitting: Emergency Medicine

## 2022-12-28 ENCOUNTER — Emergency Department (HOSPITAL_BASED_OUTPATIENT_CLINIC_OR_DEPARTMENT_OTHER): Payer: 59

## 2022-12-28 ENCOUNTER — Encounter (HOSPITAL_BASED_OUTPATIENT_CLINIC_OR_DEPARTMENT_OTHER): Payer: Self-pay

## 2022-12-28 ENCOUNTER — Emergency Department (HOSPITAL_BASED_OUTPATIENT_CLINIC_OR_DEPARTMENT_OTHER): Payer: 59 | Admitting: Radiology

## 2022-12-28 ENCOUNTER — Other Ambulatory Visit: Payer: Self-pay

## 2022-12-28 DIAGNOSIS — S93402A Sprain of unspecified ligament of left ankle, initial encounter: Secondary | ICD-10-CM

## 2022-12-28 DIAGNOSIS — S80211A Abrasion, right knee, initial encounter: Secondary | ICD-10-CM | POA: Insufficient documentation

## 2022-12-28 DIAGNOSIS — W1830XA Fall on same level, unspecified, initial encounter: Secondary | ICD-10-CM | POA: Diagnosis not present

## 2022-12-28 DIAGNOSIS — S80212A Abrasion, left knee, initial encounter: Secondary | ICD-10-CM | POA: Diagnosis not present

## 2022-12-28 DIAGNOSIS — S99911A Unspecified injury of right ankle, initial encounter: Secondary | ICD-10-CM | POA: Diagnosis not present

## 2022-12-28 DIAGNOSIS — S93492A Sprain of other ligament of left ankle, initial encounter: Secondary | ICD-10-CM | POA: Diagnosis not present

## 2022-12-28 DIAGNOSIS — M25562 Pain in left knee: Secondary | ICD-10-CM | POA: Diagnosis not present

## 2022-12-28 DIAGNOSIS — M25572 Pain in left ankle and joints of left foot: Secondary | ICD-10-CM | POA: Diagnosis not present

## 2022-12-28 MED ORDER — IBUPROFEN 800 MG PO TABS
800.0000 mg | ORAL_TABLET | Freq: Three times a day (TID) | ORAL | 0 refills | Status: DC | PRN
  Filled 2022-12-28: qty 21, 7d supply, fill #0

## 2022-12-28 MED ORDER — IBUPROFEN 800 MG PO TABS
800.0000 mg | ORAL_TABLET | Freq: Once | ORAL | Status: AC
  Administered 2022-12-28: 800 mg via ORAL
  Filled 2022-12-28: qty 1

## 2022-12-28 MED ORDER — ACETAMINOPHEN 325 MG PO TABS
650.0000 mg | ORAL_TABLET | Freq: Once | ORAL | Status: AC
  Administered 2022-12-28: 650 mg via ORAL
  Filled 2022-12-28: qty 2

## 2022-12-28 NOTE — ED Provider Notes (Signed)
[place] [facility] AT [facility] Provider Note   CSN: [id number] Arrival date & time: 12/28/22  4098     History  Chief Complaint  Patient presents with   Ankle Pain    Sharon Burns is a 22 y.o. female.  Left ankle injury while camping in the backyard.  Believes she stepped into a hole.  Ankle twisted and she fell to her knees bilaterally.  Did not hit her head.  Complains of pain to her left lateral ankle and difficulty bearing weight.  Abrasions to knees but no significant knee pain.  No head, neck, chest, abdominal pain.  Did not take any medications at home.  No weakness, numbness or tingling.  Swelling to left lateral ankle.  The history is provided by the patient.  Ankle Pain Associated symptoms: no fever        Home Medications Prior to Admission medications   Medication Sig Start Date End Date Taking? Authorizing Provider  fluconazole (DIFLUCAN) 150 MG tablet Take one tablet by mouth at the first sign of symptoms of yeast. If no resolution, repeat dose in 72 hours. 08/20/22   Tollie Eth, NP  levonorgestrel (MIRENA) 20 MCG/DAY IUD 1 each by Intrauterine route once.    [provider]  ondansetron (ZOFRAN) 4 MG tablet Take 1 tablet (4 mg total) by mouth every 8 (eight) hours as needed for nausea or vomiting. 08/20/22   Early, Sung Amabile, NP  tirzepatide (ZEPBOUND) 2.5 MG/0.5ML Pen Inject 2.5 mg into the skin once a week. 08/20/22   Tollie Eth, NP      Allergies    Patient has no known allergies.    Review of Systems   Review of Systems  Constitutional:  Negative for activity change, appetite change and fever.  HENT:  Negative for congestion and rhinorrhea.   Respiratory:  Negative for cough, chest tightness and shortness of breath.   Cardiovascular:  Negative for chest pain.  Gastrointestinal:  Negative for abdominal pain, nausea and vomiting.  Genitourinary:  Negative for dysuria and hematuria.  Musculoskeletal:  Positive for  arthralgias and myalgias.  Skin:  Negative for rash.  Neurological:  Negative for dizziness, weakness and headaches.   all other systems are negative except as noted in the HPI and PMH.    Physical Exam Updated Vital Signs BP (!) 111/99 (BP Location: Right Arm)   Pulse 96   Temp 98.5 F (36.9 C) (Oral)   Resp 20   Ht 5\' 7"  (1.702 m)   Wt 129.3 kg   SpO2 100%   BMI 44.64 kg/m  Physical Exam Vitals and nursing note reviewed.  Constitutional:      General: She is not in acute distress.    Appearance: Normal appearance. She is well-developed and normal weight.  HENT:     Head: Normocephalic and atraumatic.     Mouth/Throat:     Pharynx: No oropharyngeal exudate.  Eyes:     Conjunctiva/sclera: Conjunctivae normal.     Pupils: Pupils are equal, round, and reactive to light.  Neck:     Comments: No meningismus. Cardiovascular:     Rate and Rhythm: Normal rate and regular rhythm.     Heart sounds: Normal heart sounds. No murmur heard. Pulmonary:     Effort: Pulmonary effort is normal. No respiratory distress.     Breath sounds: Normal breath sounds.  Chest:     Chest wall: No tenderness.  Abdominal:     Palpations: Abdomen is  soft.     Tenderness: There is no abdominal tenderness. There is no guarding or rebound.  Musculoskeletal:        General: Swelling, tenderness and signs of injury present. Normal range of motion.     Cervical back: Normal range of motion and neck supple.     Comments: L lateral ankle tenderness and swelling.  Intact DP and PT pulses.   Abrasions to bilateral knees.  FROM of bilateral knees and hips  No T or L spine tenderness  Skin:    General: Skin is warm.  Neurological:     Mental Status: She is alert and oriented to person, place, and time.     Cranial Nerves: No cranial nerve deficit.     Motor: No abnormal muscle tone.     Coordination: Coordination normal.     Comments: No ataxia on finger to nose bilaterally. No pronator drift. 5/5  strength throughout. CN 2-12 intact.Equal grip strength. Sensation intact.   Psychiatric:        Behavior: Behavior normal.     ED Results / Procedures / Treatments   Labs (all labs ordered are listed, but only abnormal results are displayed) Labs Reviewed - No data to display  EKG None  Radiology DG Knee Complete 4 Views Left  Result Date: 12/28/2022 CLINICAL DATA:  Fall, knee pain/injury EXAM: LEFT KNEE - COMPLETE 4+ VIEW COMPARISON:  None Available. FINDINGS: No fracture or dislocation is seen. The joint spaces are preserved. The visualized soft tissues are unremarkable. No suprapatellar knee joint effusion. IMPRESSION: Negative. Electronically Signed   By: Charline Bills M.D.   On: 12/28/2022 03:21   DG Ankle Complete Left  Result Date: 12/28/2022 CLINICAL DATA:  Fall, left ankle pain/injury EXAM: LEFT ANKLE COMPLETE - 3+ VIEW COMPARISON:  None Available. FINDINGS: No fracture or dislocation is seen. The ankle mortise is intact. Mild soft tissue swelling overlying the lateral malleolus. The base of the fifth metatarsal is unremarkable. IMPRESSION: No fracture or dislocation is seen. Mild lateral soft tissue swelling. Electronically Signed   By: Charline Bills M.D.   On: 12/28/2022 03:20    Procedures Procedures    Medications Ordered in ED Medications  ibuprofen (ADVIL) tablet 800 mg (has no administration in time range)  acetaminophen (TYLENOL) tablet 650 mg (has no administration in time range)    ED Course/ Medical Decision Making/ A&P                             Medical Decision Making Amount and/or Complexity of Data Reviewed Labs: ordered. Decision-making details documented in ED Course. Radiology: ordered and independent interpretation performed. Decision-making details documented in ED Course. ECG/medicine tests: ordered and independent interpretation performed. Decision-making details documented in ED Course.  Risk OTC drugs. Prescription drug  management.   Fall with L ankle injury. Pain and swelling. Distal pulses intact.   No head or neck injury.  Ankle and knee x-ray negative for acute fracture or dislocation.  Results reviewed interpreted by me.  Suspect ankle sprain.  Will give ASO, crutches, ice, elevation, anti-inflammatories and orthopedic follow-up.  Return precautions discussed.        Final Clinical Impression(s) / ED Diagnoses Final diagnoses:  None    Rx / DC Orders ED Discharge Orders     None         Jaquana Geiger, Jeannett Senior, MD 12/28/22 [phone]

## 2022-12-28 NOTE — Discharge Instructions (Addendum)
Take the anti-inflammatories as prescribed, use ice and keep leg elevated when you are resting.  You may bear weight as tolerated.  Follow-up with your primary doctor.  Return to the ED with new or worsening symptoms.

## 2022-12-28 NOTE — ED Triage Notes (Signed)
Patient arrives to ED POV C/O Left ankle injury. States they were testing out a new tent in their back yard and woke up to use the bathroom, stepped in a hole while walking and fell. Swelling is noted to area. Pt unable to bare weight, pulses present. No other complaints at this time. Pt A/O x4.

## 2022-12-30 ENCOUNTER — Other Ambulatory Visit (HOSPITAL_COMMUNITY): Payer: Self-pay

## 2022-12-30 ENCOUNTER — Other Ambulatory Visit: Payer: Self-pay

## 2023-01-06 DIAGNOSIS — M546 Pain in thoracic spine: Secondary | ICD-10-CM | POA: Diagnosis not present

## 2023-01-06 DIAGNOSIS — M9907 Segmental and somatic dysfunction of upper extremity: Secondary | ICD-10-CM | POA: Diagnosis not present

## 2023-01-06 DIAGNOSIS — M9905 Segmental and somatic dysfunction of pelvic region: Secondary | ICD-10-CM | POA: Diagnosis not present

## 2023-01-06 DIAGNOSIS — M25511 Pain in right shoulder: Secondary | ICD-10-CM | POA: Diagnosis not present

## 2023-01-06 DIAGNOSIS — M9901 Segmental and somatic dysfunction of cervical region: Secondary | ICD-10-CM | POA: Diagnosis not present

## 2023-01-06 DIAGNOSIS — M9904 Segmental and somatic dysfunction of sacral region: Secondary | ICD-10-CM | POA: Diagnosis not present

## 2023-01-09 ENCOUNTER — Other Ambulatory Visit (HOSPITAL_COMMUNITY): Payer: Self-pay

## 2023-04-22 ENCOUNTER — Encounter: Payer: Self-pay | Admitting: Nurse Practitioner

## 2023-04-24 ENCOUNTER — Encounter: Payer: Self-pay | Admitting: Family Medicine

## 2023-04-24 ENCOUNTER — Other Ambulatory Visit (HOSPITAL_COMMUNITY): Payer: Self-pay

## 2023-04-24 ENCOUNTER — Ambulatory Visit: Payer: 59 | Admitting: Family Medicine

## 2023-04-24 VITALS — BP 114/60 | HR 90 | Ht 68.0 in | Wt 290.2 lb

## 2023-04-24 DIAGNOSIS — H60332 Swimmer's ear, left ear: Secondary | ICD-10-CM

## 2023-04-24 DIAGNOSIS — H6502 Acute serous otitis media, left ear: Secondary | ICD-10-CM

## 2023-04-24 MED ORDER — NEOMYCIN-POLYMYXIN-HC 3.5-10000-1 OT SUSP
2.0000 [drp] | Freq: Three times a day (TID) | OTIC | 0 refills | Status: DC
Start: 2023-04-24 — End: 2024-01-19
  Filled 2023-04-24: qty 10, 7d supply, fill #0

## 2023-04-24 MED ORDER — AMOXICILLIN 875 MG PO TABS
875.0000 mg | ORAL_TABLET | Freq: Two times a day (BID) | ORAL | 0 refills | Status: DC
Start: 2023-04-24 — End: 2024-01-19
  Filled 2023-04-24: qty 20, 10d supply, fill #0

## 2023-04-24 NOTE — Progress Notes (Signed)
   Subjective:    Patient ID: Sharon Burns, female    DOB: July 30, 2000, 22 y.o.   MRN: [id number]  HPI She states that for the last 2 days she has had left earache and sometimes also notes right-sided ear pain.  No fever, chills, sore throat.  Movement of the ear does cause some discomfort.  She does admit to getting water in the ears last weekend.   Review of Systems     Objective:    Physical Exam Alert and in no distress.  Right TM and canal is totally normal.  Left canal is narrow and pulling on ear is uncomfortable.  The TM is dull and slightly retracted.       Assessment & Plan:  Acute serous otitis media of left ear, recurrence not specified - Plan: amoxicillin (AMOXIL) 875 MG tablet  Swimmer's ear of left side, unspecified chronicity - Plan: neomycin-polymyxin-hydrocortisone (CORTISPORIN) 3.5-10000-1 OTIC suspension

## 2023-12-08 ENCOUNTER — Other Ambulatory Visit (HOSPITAL_COMMUNITY): Payer: Self-pay

## 2023-12-08 ENCOUNTER — Telehealth: Admitting: Family Medicine

## 2023-12-08 DIAGNOSIS — H60543 Acute eczematoid otitis externa, bilateral: Secondary | ICD-10-CM

## 2023-12-08 MED ORDER — TRIAMCINOLONE ACETONIDE 0.025 % EX OINT
1.0000 | TOPICAL_OINTMENT | Freq: Two times a day (BID) | CUTANEOUS | 0 refills | Status: DC
Start: 2023-12-08 — End: 2024-01-19
  Filled 2023-12-08: qty 30, 15d supply, fill #0

## 2023-12-08 NOTE — Progress Notes (Signed)

## 2023-12-29 DIAGNOSIS — Z01419 Encounter for gynecological examination (general) (routine) without abnormal findings: Secondary | ICD-10-CM | POA: Diagnosis not present

## 2023-12-29 DIAGNOSIS — N760 Acute vaginitis: Secondary | ICD-10-CM | POA: Diagnosis not present

## 2023-12-31 ENCOUNTER — Ambulatory Visit: Admitting: Family Medicine

## 2024-01-13 ENCOUNTER — Encounter: Payer: Self-pay | Admitting: Nurse Practitioner

## 2024-01-19 ENCOUNTER — Ambulatory Visit: Payer: Self-pay | Admitting: Medical

## 2024-01-19 ENCOUNTER — Other Ambulatory Visit (HOSPITAL_COMMUNITY): Payer: Self-pay

## 2024-01-19 VITALS — BP 118/74 | HR 86 | Wt 279.8 lb

## 2024-01-19 DIAGNOSIS — R9431 Abnormal electrocardiogram [ECG] [EKG]: Secondary | ICD-10-CM

## 2024-01-19 DIAGNOSIS — R0789 Other chest pain: Secondary | ICD-10-CM | POA: Diagnosis not present

## 2024-01-19 DIAGNOSIS — Z8659 Personal history of other mental and behavioral disorders: Secondary | ICD-10-CM | POA: Diagnosis not present

## 2024-01-19 MED ORDER — HYDROXYZINE HCL 10 MG PO TABS
10.0000 mg | ORAL_TABLET | Freq: Three times a day (TID) | ORAL | 0 refills | Status: AC | PRN
  Filled 2024-01-19: qty 30, 10d supply, fill #0

## 2024-01-19 NOTE — Progress Notes (Signed)
 Subjective:  Sharon Burns is a 23 y.o. female who presents for Chief Complaint  Patient presents with   Acute Visit    Chest discomfort- breathing out- has tightness, been going on a couple weeks. Sudden movements can trigger it sometimes. First time was when she was pushing during a bowel movement and she had chest discomfort. Not sure if its anxiety related. GAD-7 is abnormal     Here for c/o chest discomfort.  Having tightness in chest, particularly breathing out or pushing with bowel movements.  It is intermittent in spells over last 3 weeks.  Not sure if its anxiety or other.  When a spell comes on, feels discomfort 20-30 minutes.  Sometimes feels lightheaded or dizzy if gets up too fast.  No medication for the spells.    Exercises 2-3 days per week.  Is a massage therapist, has been trying to eat healthy of late.   No similar symptoms with exercise.  Bowel movements have been normal 2-3 BMs daily.  Sometimes gets indigestion and gas.  No diarrhea or late.  No constipation.   No pain in extremities with the symptoms.     Relieving factors - moving, stretching, deep breathing.    Not current seeing counselor.   Last counseling 3-4 years ago with anxiety.  Was on medication on the medication at that time too.    No confusion, numbness, tingling, weakness, faint.    She has one recent stressor.  She and husband live with her mother in law, and was recently advised they have to move out in a few months to their first ever apartment  She loves her job, has good friend support  No other aggravating or relieving factors.    No other c/o.  Past Medical History:  Diagnosis Date   Anxiety    Obesity (BMI 35.0-39.9 without comorbidity)    Oral contraceptive causing adverse effect in therapeutic use    Self-mutilation    No current outpatient medications on file prior to visit.   No current facility-administered medications on file prior to visit.     The following portions of the  patient's history were reviewed and updated as appropriate: allergies, current medications, past family history, past medical history, past social history, past surgical history and problem list.  ROS Otherwise as in subjective above     Objective: BP 118/74   Pulse 86   Wt 279 lb 12.8 oz (126.9 kg)   SpO2 98%   BMI 42.54 kg/m   General appearance: alert, no distress, well developed, well nourished Neck: supple, no lymphadenopathy, no thyromegaly, no masses Heart: RRR, normal S1, S2, no murmurs Lungs: CTA bilaterally, no wheezes, rhonchi, or rales Abdomen: +bs, soft, non tender, non distended, no masses, no hepatomegaly, no splenomegaly Pulses: 2+ radial pulses, 2+ pedal pulses, normal cap refill Ext: no edema Psych: pleasant, good eye contact, answers questions approprietly    Assessment: Encounter Diagnoses  Name Primary?   Chest discomfort Yes   Abnormal EKG    History of anxiety      Plan: We discussed her symptoms and concerns and possible differential.  Her EKG does show some T wave inversions V1 through the 3 today.  QTc was elevated.  She also at times today during exam had a pulse rate of around 110 bpm resting.  Anxiety can certainly be a factor in her situation  I asked her to start checking her pulse rate and get me some pulse readings at rest in  the next 3 days.  If her pulse at rest is over 100 we will also potentially pursue event monitor such as ZIO  In the meantime referral to cardiology for baseline given the EKG findings and symptoms to help rule out certain conditions  She is coming in in about a month to see her PCP here for yearly physical and blood work so she declines blood work today/we will hold off the time being  In the meantime she can use hydroxyzine  for anxious feelings or other symptoms.  We discussed this versus propranolol beta-blocker as options.  Consider counseling.  Reduce stress where possible.  I asked her to cut back on  caffeine for the time being.  If any new or worsening symptoms in the next week or 2 , then let us  know or follow up for recheck.  No current concern for sleep apnea based on symptoms  Labs from 08/2022 were mostly unremarkable.   [name] was seen today for acute visit.  Diagnoses and all orders for this visit:  Chest discomfort -     Ambulatory referral to Cardiology -     EKG 12-Lead  Abnormal EKG -     Ambulatory referral to Cardiology -     EKG 12-Lead  History of anxiety  Other orders -     hydrOXYzine  (ATARAX ) 10 MG tablet; Take 1 tablet (10 mg total) by mouth 3 (three) times daily as needed.    Follow up: pending call back

## 2024-02-19 ENCOUNTER — Encounter: Payer: Self-pay | Admitting: Nurse Practitioner

## 2024-02-19 ENCOUNTER — Ambulatory Visit: Payer: Self-pay | Admitting: Nurse Practitioner

## 2024-02-19 VITALS — BP 122/74 | HR 84 | Ht 67.5 in | Wt 277.2 lb

## 2024-02-19 DIAGNOSIS — R0789 Other chest pain: Secondary | ICD-10-CM | POA: Diagnosis not present

## 2024-02-19 DIAGNOSIS — Z0000 Encounter for general adult medical examination without abnormal findings: Secondary | ICD-10-CM | POA: Diagnosis not present

## 2024-02-19 DIAGNOSIS — F439 Reaction to severe stress, unspecified: Secondary | ICD-10-CM | POA: Diagnosis not present

## 2024-02-19 DIAGNOSIS — Z6841 Body Mass Index (BMI) 40.0 and over, adult: Secondary | ICD-10-CM

## 2024-02-19 DIAGNOSIS — R9431 Abnormal electrocardiogram [ECG] [EKG]: Secondary | ICD-10-CM

## 2024-02-19 DIAGNOSIS — F411 Generalized anxiety disorder: Secondary | ICD-10-CM

## 2024-02-19 DIAGNOSIS — Z833 Family history of diabetes mellitus: Secondary | ICD-10-CM | POA: Diagnosis not present

## 2024-02-19 NOTE — Patient Instructions (Signed)
 Keep up the great work with your health!! You are doing the right things.   Diet recommendations for high fiber (about 25-30 grams a day) and lean protein diet (aim for 70-90 grams a day) with moderate -low carbohydrates (100-150 grams a day).  Exercise recommendations to aim for 150 minutes of moderate intensity activity a week. This is sustained activity that gets your heart rate up and has you sweat, but still allows you to carry on a conversation.    Managing Stress, Adult Feeling a certain amount of stress is normal. Stress helps our body and mind get ready to deal with the demands of life. Stress hormones can motivate you to do well at work and meet your responsibilities. But severe or long-term (chronic) stress can affect your mental and physical health. Chronic stress puts you at higher risk for: Anxiety and depression. Other health problems such as digestive problems, muscle aches, heart disease, high blood pressure, and stroke. What are the causes? Common causes of stress include: Demands from work, such as deadlines, feeling overworked, or having long hours. Pressures at home, such as money issues, disagreements with a spouse, or parenting issues. Pressures from major life changes, such as divorce, moving, loss of a loved one, or chronic illness. You may be at higher risk for stress-related problems if you: Do not get enough sleep. Are in poor health. Do not have emotional support. Have a mental health disorder such as anxiety or depression. How to recognize stress Stress can make you: Have trouble sleeping. Feel sad, anxious, irritable, or overwhelmed. Lose your appetite. Overeat or want to eat unhealthy foods. Want to use drugs or alcohol. Stress can also cause physical symptoms, such as: Sore, tense muscles, especially in the shoulders and neck. Headaches. Trouble breathing. A faster heart rate. Stomach pain, nausea, or vomiting. Diarrhea or constipation. Trouble  concentrating. Follow these instructions at home: Eating and drinking Eat a healthy diet. This includes: Eating foods that are high in fiber, such as beans, whole grains, and fresh fruits and vegetables. Limiting foods that are high in fat and processed sugars, such as fried or sweet foods. Do not skip meals or overeat. Drink enough fluid to keep your urine pale yellow. Alcohol use Do not drink alcohol if: Your health care provider tells you not to drink. You are pregnant, may be pregnant, or are planning to become pregnant. Drinking alcohol is a way some people try to ease their stress. This can be dangerous, so if you drink alcohol: Limit how much you have to: 0-1 drink a day for women. 0-2 drinks a day for men. Know how much alcohol is in your drink. In the [place] one drink equals one 12 oz bottle of beer (355 mL), one 5 oz glass of wine (148 mL), or one 1 oz glass of hard liquor (44 mL). Activity  Include 30 minutes of exercise in your daily schedule. Exercise is a good stress reducer. Include time in your day for an activity that you find relaxing. Try taking a walk, going on a bike ride, reading a book, or listening to music. Schedule your time in a way that lowers stress, and keep a regular schedule. Focus on doing what is most important to get done. Lifestyle Identify the source of your stress and your reaction to it. See a therapist who can help you change unhelpful reactions. When there are stressful events: Talk about them with family, friends, or coworkers. Try to think realistically about stressful events and  not ignore them or overreact. Try to find the positives in a stressful situation and not focus on the negatives. Cut back on responsibilities at work and home, if possible. Ask for help from friends or family members if you need it. Find ways to manage stress, such as: Mindfulness, meditation, or deep breathing. Yoga or tai chi. Progressive muscle  relaxation. Spending time in nature. Doing art, playing music, or reading. Making time for fun activities. Spending time with family and friends. Get support from family, friends, or spiritual resources. General instructions Get enough sleep. Try to go to sleep and get up at about the same time every day. Take over-the-counter and prescription medicines only as told by your health care provider. Do not use any products that contain nicotine or tobacco. These products include cigarettes, chewing tobacco, and vaping devices, such as e-cigarettes. If you need help quitting, ask your health care provider. Do not use drugs or smoke to deal with stress. Keep all follow-up visits. This is important. Where to find support Talk with your health care provider about stress management or finding a support group. Find a therapist to work with you on your stress management techniques. Where to find more information [employer] on Mental Illness: www.nami.org American Psychological Association: [employer]ca Contact a health care provider if: Your stress symptoms get worse. You are unable to manage your stress at home. You are struggling to stop using drugs or alcohol. Get help right away if: You may be a danger to yourself or others. You have any thoughts of death or suicide. Get help right awayif you feel like you may hurt yourself or others, or have thoughts about taking your own life. Go to your nearest emergency room or: Call 911. Call the National Suicide Prevention Lifeline at 331-829-8424 or 988 in the [place] This is open 24 hours a day. If you're a Veteran: Call 988 and press 1. This is open 24 hours a day. Text the [employer] at 718-853-7039. Summary Feeling a certain amount of stress is normal, but severe or long-term (chronic) stress can affect your mental and physical health. Chronic stress can put you at higher risk for anxiety, depression, and other health problems such as  digestive problems, muscle aches, heart disease, high blood pressure, and stroke. You may be at higher risk for stress-related problems if you do not get enough sleep, are in poor health, lack emotional support, or have a mental health disorder such as anxiety or depression. Identify the source of your stress and your reaction to it. Try talking about stressful events with family, friends, or coworkers, finding a coping method, or getting support from spiritual resources. If you need more help, talk with your health care provider about finding a support group or a mental health therapist. This information is not intended to replace advice given to you by your health care provider. Make sure you discuss any questions you have with your health care provider. Document Revised: 01/30/2023 Document Reviewed: 01/09/2021 Elsevier Patient Education  2024 [employer].

## 2024-02-19 NOTE — Progress Notes (Signed)
 Sharon Doing, DNP, AGNP-c [facility]  [address] [place], [place] [address] [phone]  ESTABLISHED PATIENT- Chronic Health and/or Follow-Up Visit on 02/19/2024  Blood pressure 122/74, pulse 84, height 5' 7.5 (1.715 m), weight 277 lb 3.2 oz (125.7 kg), last menstrual period 01/12/2024.   HPI: History of Present Illness Sharon Burns is a 23 year old female who presents with chest pain.  She has been experiencing chest pain for about a month, described as a 3 to 4 out of 10 in severity, located at the sternum, and not radiating to the neck, arms, or back. The pain sometimes occurs with deep breaths but does not interfere with her ability to exercise. Her heart rate is typically between 60 and 80 beats per minute at rest, occasionally exceeding 100 beats per minute but normalizing with deep breathing. No major symptoms affect her daily activities.  A recent EKG showed a right ventricular conduction delay. She has not had an EKG before, and her father suggested this might be her normal state.  She is managing stress from moving and work-related pressures. She engages in regular physical activity, including weight training and roller skating, and has made dietary changes to include more vegetables, contributing to weight loss. No significant changes in hunger, thirst, or urination, and maintains a regular menstrual cycle with her IUD.  No swelling in her feet or ankles, no changes in bowel or bladder habits, no difficulty swallowing, and no hearing or vision concerns.  All ROS negative with exception of what is listed above.    PHYSICAL EXAM Physical Exam Vitals and nursing note reviewed.  Constitutional:      General: She is not in acute distress.    Appearance: Normal appearance.  HENT:     Head: Normocephalic.     Right Ear: Tympanic membrane normal.     Left Ear: Tympanic membrane normal.     Nose: Nose normal.     Mouth/Throat:     Mouth:  Mucous membranes are moist.     Pharynx: Oropharynx is clear.  Eyes:     Conjunctiva/sclera: Conjunctivae normal.     Pupils: Pupils are equal, round, and reactive to light.  Neck:     Vascular: No carotid bruit.  Cardiovascular:     Rate and Rhythm: Normal rate and regular rhythm.     Pulses: Normal pulses.     Heart sounds: Normal heart sounds.  Pulmonary:     Effort: Pulmonary effort is normal.     Breath sounds: Normal breath sounds.  Abdominal:     General: Bowel sounds are normal. There is no distension.     Palpations: Abdomen is soft.     Tenderness: There is no abdominal tenderness. There is no right CVA tenderness, left CVA tenderness or guarding.  Musculoskeletal:     Cervical back: No tenderness.     Right lower leg: No edema.     Left lower leg: No edema.  Lymphadenopathy:     Cervical: No cervical adenopathy.  Skin:    General: Skin is warm and dry.     Capillary Refill: Capillary refill takes less than 2 seconds.  Neurological:     General: No focal deficit present.     Mental Status: She is alert and oriented to person, place, and time.  Psychiatric:        Mood and Affect: Mood normal.        Behavior: Behavior normal.     PLAN Problem List  Items Addressed This Visit     Generalized anxiety disorder   Experiencing situational stress due to upcoming move, work-related pressures, and family dynamics. Stress is well-managed with exercise, reading, and massage therapy. Symptoms are expected to improve once situational stressors resolve. - Continue current stress management techniques including exercise, reading, and massage therapy. - Monitor stress levels and reassess if symptoms persist after situational stressors resolve.      Relevant Orders   Hemoglobin A1c (Completed)   CBC with Differential/Platelet (Completed)   Comprehensive metabolic panel with GFR (Completed)   TSH (Completed)   VITAMIN D  25 Hydroxy (Vit-D Deficiency, Fractures) (Completed)    T4, free (Completed)   Insulin , random (Completed)   Iron, TIBC and Ferritin Panel (Completed)   Encounter for annual physical exam - Primary   CPE completed today. Review of HM activities and recommendations discussed and provided on AVS. Anticipatory guidance, diet, and exercise recommendations provided. Medications, allergies, and hx reviewed and updated as necessary. Orders placed as listed below.  Plan: - Labs ordered. Will make changes as necessary based on results.  - I will review these results and send recommendations via MyChart or a telephone call.  - F/U with CPE in 1 year or sooner for acute/chronic health needs as directed.        Family history of diabetes mellitus (DM)   Labs today      Relevant Orders   Hemoglobin A1c (Completed)   CBC with Differential/Platelet (Completed)   Comprehensive metabolic panel with GFR (Completed)   TSH (Completed)   VITAMIN D  25 Hydroxy (Vit-D Deficiency, Fractures) (Completed)   T4, free (Completed)   Insulin , random (Completed)   Iron, TIBC and Ferritin Panel (Completed)   BMI 40.0-44.9, adult (HCC)   Weight has decreased from approximately 300 lbs to 275 lbs. Engaging in regular physical activity including weight training, cardio, and roller skating. Dietary changes include increased vegetable intake. Current lifestyle changes are sustainable and contributing to weight loss. - Continue current exercise regimen and dietary modifications. - Encourage high fiber and lean protein intake with moderate carbohydrates.      Relevant Orders   Hemoglobin A1c (Completed)   CBC with Differential/Platelet (Completed)   Comprehensive metabolic panel with GFR (Completed)   TSH (Completed)   VITAMIN D  25 Hydroxy (Vit-D Deficiency, Fractures) (Completed)   T4, free (Completed)   Insulin , random (Completed)   Iron, TIBC and Ferritin Panel (Completed)   Chest pain, mid sternal   Intermittent chest pain localized to the sternum, rated 3-4/10 in  severity, non-radiating, and not exacerbated by physical activity. Symptoms are not suggestive of cardiac origin. EKG shows right ventricular conduction delay, not present in all leads, indicating a minor conduction abnormality. No significant delay noted, reducing concern for serious cardiac issues. Differential includes anxiety and possible iron deficiency. Symptoms have improved with stress management techniques. - Coordinate with referral coordinator to confirm cardiology appointment at [facility]. - Order blood work to check for iron deficiency and other potential causes of chest discomfort.      Relevant Orders   Hemoglobin A1c (Completed)   CBC with Differential/Platelet (Completed)   Comprehensive metabolic panel with GFR (Completed)   TSH (Completed)   VITAMIN D  25 Hydroxy (Vit-D Deficiency, Fractures) (Completed)   T4, free (Completed)   Insulin , random (Completed)   Iron, TIBC and Ferritin Panel (Completed)   Other Visit Diagnoses       Abnormal EKG       Relevant Orders   Hemoglobin  A1c (Completed)   CBC with Differential/Platelet (Completed)   Comprehensive metabolic panel with GFR (Completed)   TSH (Completed)   VITAMIN D  25 Hydroxy (Vit-D Deficiency, Fractures) (Completed)   T4, free (Completed)   Insulin , random (Completed)   Iron, TIBC and Ferritin Panel (Completed)     Situational stress            Return in about 1 year (around 02/18/2025) for CPE.  SaraBeth Atzel Mccambridge, DNP, AGNP-c Time: 38 minutes, >50% spent counseling, care coordination, chart review, and documentation.  SABRAtime

## 2024-02-20 LAB — CBC WITH DIFFERENTIAL/PLATELET
Basophils Absolute: 0 x10E3/uL (ref 0.0–0.2)
Basos: 0 %
EOS (ABSOLUTE): 0.1 x10E3/uL (ref 0.0–0.4)
Eos: 1 %
Hematocrit: 45.7 % (ref 34.0–46.6)
Hemoglobin: 15 g/dL (ref 11.1–15.9)
Immature Grans (Abs): 0 x10E3/uL (ref 0.0–0.1)
Immature Granulocytes: 0 %
Lymphocytes Absolute: 1.5 x10E3/uL (ref 0.7–3.1)
Lymphs: 19 %
MCH: 28.2 pg (ref 26.6–33.0)
MCHC: 32.8 g/dL (ref 31.5–35.7)
MCV: 86 fL (ref 79–97)
Monocytes Absolute: 0.7 x10E3/uL (ref 0.1–0.9)
Monocytes: 9 %
Neutrophils Absolute: 5.6 x10E3/uL (ref 1.4–7.0)
Neutrophils: 71 %
Platelets: 311 x10E3/uL (ref 150–450)
RBC: 5.31 x10E6/uL — ABNORMAL HIGH (ref 3.77–5.28)
RDW: 12.8 % (ref 11.7–15.4)
WBC: 8 x10E3/uL (ref 3.4–10.8)

## 2024-02-20 LAB — TSH: TSH: 0.974 u[IU]/mL (ref 0.450–4.500)

## 2024-02-20 LAB — HEMOGLOBIN A1C
Est. average glucose Bld gHb Est-mCnc: 91 mg/dL
Hgb A1c MFr Bld: 4.8 % (ref 4.8–5.6)

## 2024-02-20 LAB — IRON,TIBC AND FERRITIN PANEL
Ferritin: 34 ng/mL (ref 15–150)
Iron Saturation: 33 (ref 15–55)
Iron: 103 ug/dL (ref 27–159)
Total Iron Binding Capacity: 314 ug/dL (ref 250–450)
UIBC: 211 ug/dL (ref 131–425)

## 2024-02-20 LAB — COMPREHENSIVE METABOLIC PANEL WITH GFR
ALT: 14 IU/L (ref 0–32)
AST: 19 IU/L (ref 0–40)
Albumin: 4.3 g/dL (ref 4.0–5.0)
Alkaline Phosphatase: 84 IU/L (ref 44–121)
BUN/Creatinine Ratio: 14 (ref 9–23)
BUN: 12 mg/dL (ref 6–20)
Bilirubin Total: 0.5 mg/dL (ref 0.0–1.2)
CO2: 19 mmol/L — AB (ref 20–29)
Calcium: 9.5 mg/dL (ref 8.7–10.2)
Chloride: 104 mmol/L (ref 96–106)
Creatinine, Ser: 0.83 mg/dL (ref 0.57–1.00)
Globulin, Total: 2.7 g/dL (ref 1.5–4.5)
Glucose: 80 mg/dL (ref 70–99)
Potassium: 4.2 mmol/L (ref 3.5–5.2)
Sodium: 138 mmol/L (ref 134–144)
Total Protein: 7 g/dL (ref 6.0–8.5)
eGFR: 103 mL/min/1.73 (ref >59)

## 2024-02-20 LAB — VITAMIN D 25 HYDROXY (VIT D DEFICIENCY, FRACTURES): Vit D, 25-Hydroxy: 32.2 ng/mL (ref 30.0–100.0)

## 2024-02-20 LAB — T4, FREE: Free T4: 1.31 ng/dL (ref 0.82–1.77)

## 2024-02-20 LAB — INSULIN, RANDOM: INSULIN: 8.9 u[IU]/mL (ref 2.6–24.9)

## 2024-02-25 ENCOUNTER — Ambulatory Visit: Payer: Self-pay | Admitting: Nurse Practitioner

## 2024-02-25 NOTE — Assessment & Plan Note (Signed)
 Labs today

## 2024-02-25 NOTE — Assessment & Plan Note (Signed)
 Experiencing situational stress due to upcoming move, work-related pressures, and family dynamics. Stress is well-managed with exercise, reading, and massage therapy. Symptoms are expected to improve once situational stressors resolve. - Continue current stress management techniques including exercise, reading, and massage therapy. - Monitor stress levels and reassess if symptoms persist after situational stressors resolve.

## 2024-02-25 NOTE — Assessment & Plan Note (Signed)

## 2024-02-25 NOTE — Assessment & Plan Note (Signed)
 Intermittent chest pain localized to the sternum, rated 3-4/10 in severity, non-radiating, and not exacerbated by physical activity. Symptoms are not suggestive of cardiac origin. EKG shows right ventricular conduction delay, not present in all leads, indicating a minor conduction abnormality. No significant delay noted, reducing concern for serious cardiac issues. Differential includes anxiety and possible iron deficiency. Symptoms have improved with stress management techniques. - Coordinate with referral coordinator to confirm cardiology appointment at [facility]. - Order blood work to check for iron deficiency and other potential causes of chest discomfort.

## 2024-02-25 NOTE — Assessment & Plan Note (Signed)
 Weight has decreased from approximately 300 lbs to 275 lbs. Engaging in regular physical activity including weight training, cardio, and roller skating. Dietary changes include increased vegetable intake. Current lifestyle changes are sustainable and contributing to weight loss. - Continue current exercise regimen and dietary modifications. - Encourage high fiber and lean protein intake with moderate carbohydrates.

## 2024-08-10 ENCOUNTER — Encounter: Payer: Self-pay | Admitting: Nurse Practitioner

## 2025-03-08 ENCOUNTER — Encounter: Payer: Self-pay | Admitting: Nurse Practitioner
